# Patient Record
Sex: Male | Born: 1960 | Race: Black or African American | Hispanic: No | Smoking: Never smoker
Health system: Southern US, Community
[De-identification: ages and names within clinical notes are randomized; demographics above are authoritative.]

## PROBLEM LIST (undated history)

## (undated) DIAGNOSIS — I10 Essential (primary) hypertension: Secondary | ICD-10-CM

## (undated) HISTORY — PX: KNEE SURGERY: SHX244

---

## 2018-05-25 ENCOUNTER — Emergency Department (HOSPITAL_COMMUNITY)
Admission: EM | Admit: 2018-05-25 | Discharge: 2018-05-26 | Disposition: A | Discharge status: Home / Self Care | Payer: Self-pay | Attending: Emergency Medicine | Admitting: Emergency Medicine

## 2018-05-25 ENCOUNTER — Encounter (HOSPITAL_COMMUNITY): Payer: Self-pay | Admitting: Family Medicine

## 2018-05-25 DIAGNOSIS — Z041 Encounter for examination and observation following transport accident: Secondary | ICD-10-CM | POA: Insufficient documentation

## 2018-05-25 DIAGNOSIS — Y92488 Other paved roadways as the place of occurrence of the external cause: Secondary | ICD-10-CM | POA: Insufficient documentation

## 2018-05-25 DIAGNOSIS — Y9389 Activity, other specified: Secondary | ICD-10-CM | POA: Insufficient documentation

## 2018-05-25 DIAGNOSIS — Y990 Civilian activity done for income or pay: Secondary | ICD-10-CM | POA: Insufficient documentation
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
## 2018-05-25 NOTE — ED Triage Notes (Signed)
Called pt for triage x1 No response 

## 2018-05-26 NOTE — ED Notes (Signed)
Pt ambulatory to restroom

## 2018-05-26 NOTE — ED Notes (Signed)
Pt made aware that he just need to provide urine.  He states he cannot urinate at this time.  Water given to pt
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
## 2019-03-06 ENCOUNTER — Encounter (HOSPITAL_BASED_OUTPATIENT_CLINIC_OR_DEPARTMENT_OTHER): Payer: Self-pay | Admitting: Emergency Medicine

## 2019-03-06 ENCOUNTER — Emergency Department (HOSPITAL_BASED_OUTPATIENT_CLINIC_OR_DEPARTMENT_OTHER): Payer: No Typology Code available for payment source

## 2019-03-06 ENCOUNTER — Other Ambulatory Visit: Payer: Self-pay

## 2019-03-06 ENCOUNTER — Emergency Department (HOSPITAL_BASED_OUTPATIENT_CLINIC_OR_DEPARTMENT_OTHER)
Admission: EM | Admit: 2019-03-06 | Discharge: 2019-03-07 | Disposition: A | Discharge status: Home / Self Care | Payer: No Typology Code available for payment source | Attending: Emergency Medicine | Admitting: Emergency Medicine

## 2019-03-06 DIAGNOSIS — Y92410 Unspecified street and highway as the place of occurrence of the external cause: Secondary | ICD-10-CM | POA: Diagnosis not present

## 2019-03-06 DIAGNOSIS — S40022A Contusion of left upper arm, initial encounter: Secondary | ICD-10-CM | POA: Insufficient documentation

## 2019-03-06 DIAGNOSIS — Y999 Unspecified external cause status: Secondary | ICD-10-CM | POA: Insufficient documentation

## 2019-03-06 DIAGNOSIS — S39012A Strain of muscle, fascia and tendon of lower back, initial encounter: Secondary | ICD-10-CM | POA: Diagnosis not present

## 2019-03-06 DIAGNOSIS — S3992XA Unspecified injury of lower back, initial encounter: Secondary | ICD-10-CM | POA: Diagnosis present

## 2019-03-06 DIAGNOSIS — I10 Essential (primary) hypertension: Secondary | ICD-10-CM | POA: Insufficient documentation

## 2019-03-06 DIAGNOSIS — Y93I9 Activity, other involving external motion: Secondary | ICD-10-CM | POA: Insufficient documentation

## 2019-03-06 HISTORY — DX: Essential (primary) hypertension: I10
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
## 2019-03-06 NOTE — ED Notes (Signed)
EDP notified of pt's blood pressure 177/109 at time of discharge  Instructed pt to keep track of his blood pressure over the next week and to follow up with his primary care doctor if it continues to be elevated
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
## 2019-03-06 NOTE — Discharge Instructions (Addendum)
You may use over-the-counter Motrin (Ibuprofen), Acetaminophen (Tylenol), topical muscle creams such as SalonPas, Icy Hot, Bengay, etc. Please stretch, apply heat, and have massage therapy for additional assistance. ° °
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
